# Patient Record
Sex: Female | Born: 1965 | Race: Black or African American | Hispanic: No | Marital: Single | Smoking: Never smoker
Health system: Southern US, Community
[De-identification: ages and names within clinical notes are randomized; demographics above are authoritative.]

## PROBLEM LIST (undated history)

## (undated) DIAGNOSIS — R011 Cardiac murmur, unspecified: Secondary | ICD-10-CM

## (undated) DIAGNOSIS — I639 Cerebral infarction, unspecified: Secondary | ICD-10-CM

## (undated) DIAGNOSIS — I10 Essential (primary) hypertension: Secondary | ICD-10-CM

## (undated) DIAGNOSIS — F32A Depression, unspecified: Secondary | ICD-10-CM

## (undated) DIAGNOSIS — R519 Headache, unspecified: Secondary | ICD-10-CM

## (undated) HISTORY — DX: Depression, unspecified: F32.A

## (undated) HISTORY — DX: Cardiac murmur, unspecified: R01.1

## (undated) HISTORY — PX: APPENDECTOMY: SHX54

## (undated) HISTORY — PX: ABDOMINAL HYSTERECTOMY: SHX81

## (undated) HISTORY — DX: Headache, unspecified: R51.9

---

## 1997-12-04 ENCOUNTER — Inpatient Hospital Stay (HOSPITAL_COMMUNITY): Admission: AD | Admit: 1997-12-04 | Discharge: 1997-12-04 | Payer: Self-pay | Admitting: *Deleted

## 1998-04-02 ENCOUNTER — Emergency Department (HOSPITAL_COMMUNITY): Admission: EM | Admit: 1998-04-02 | Discharge: 1998-04-02 | Payer: Self-pay | Admitting: Emergency Medicine

## 1998-10-28 ENCOUNTER — Inpatient Hospital Stay (HOSPITAL_COMMUNITY): Admission: AD | Admit: 1998-10-28 | Discharge: 1998-10-28 | Payer: Self-pay | Admitting: Obstetrics

## 1999-09-28 ENCOUNTER — Emergency Department (HOSPITAL_COMMUNITY): Admission: EM | Admit: 1999-09-28 | Discharge: 1999-09-28 | Payer: Self-pay | Admitting: Emergency Medicine

## 1999-10-07 ENCOUNTER — Encounter: Admission: RE | Admit: 1999-10-07 | Discharge: 1999-10-07 | Payer: Self-pay | Admitting: Family Medicine

## 1999-10-07 ENCOUNTER — Encounter: Payer: Self-pay | Admitting: Family Medicine

## 2000-05-04 ENCOUNTER — Emergency Department (HOSPITAL_COMMUNITY): Admission: EM | Admit: 2000-05-04 | Discharge: 2000-05-05 | Payer: Self-pay | Admitting: Internal Medicine

## 2001-12-28 ENCOUNTER — Other Ambulatory Visit: Admission: RE | Admit: 2001-12-28 | Discharge: 2001-12-28 | Payer: Self-pay | Admitting: Family Medicine

## 2004-05-10 ENCOUNTER — Emergency Department (HOSPITAL_COMMUNITY): Admission: EM | Admit: 2004-05-10 | Discharge: 2004-05-10 | Payer: Self-pay | Admitting: Emergency Medicine

## 2009-05-11 ENCOUNTER — Ambulatory Visit (HOSPITAL_COMMUNITY): Admission: RE | Admit: 2009-05-11 | Discharge: 2009-05-12 | Payer: Self-pay | Admitting: Obstetrics & Gynecology

## 2009-05-11 ENCOUNTER — Encounter (INDEPENDENT_AMBULATORY_CARE_PROVIDER_SITE_OTHER): Payer: Self-pay | Admitting: Obstetrics & Gynecology

## 2010-04-16 LAB — CBC
HCT: 29.1 % — ABNORMAL LOW (ref 36.0–46.0)
Hemoglobin: 9.4 g/dL — ABNORMAL LOW (ref 12.0–15.0)
MCHC: 32.4 g/dL (ref 30.0–36.0)
MCV: 78.2 fL (ref 78.0–100.0)
Platelets: 282 10*3/uL (ref 150–400)
RBC: 3.72 MIL/uL — ABNORMAL LOW (ref 3.87–5.11)
RDW: 16.4 % — ABNORMAL HIGH (ref 11.5–15.5)
WBC: 17.5 10*3/uL — ABNORMAL HIGH (ref 4.0–10.5)

## 2010-04-17 LAB — CBC
HCT: 34.6 % — ABNORMAL LOW (ref 36.0–46.0)
Hemoglobin: 11.2 g/dL — ABNORMAL LOW (ref 12.0–15.0)
MCHC: 32.4 g/dL (ref 30.0–36.0)
MCV: 77.3 fL — ABNORMAL LOW (ref 78.0–100.0)
Platelets: 333 10*3/uL (ref 150–400)
RBC: 4.48 MIL/uL (ref 3.87–5.11)
RDW: 16.5 % — ABNORMAL HIGH (ref 11.5–15.5)
WBC: 7.5 10*3/uL (ref 4.0–10.5)

## 2010-04-17 LAB — TYPE AND SCREEN
ABO/RH(D): O POS
Antibody Screen: NEGATIVE

## 2010-04-17 LAB — BASIC METABOLIC PANEL
BUN: 4 mg/dL — ABNORMAL LOW (ref 6–23)
CO2: 27 mEq/L (ref 19–32)
Calcium: 8.7 mg/dL (ref 8.4–10.5)
Chloride: 105 mEq/L (ref 96–112)
Creatinine, Ser: 0.58 mg/dL (ref 0.4–1.2)
GFR calc Af Amer: >60 mL/min (ref >60)
GFR calc non Af Amer: >60 mL/min (ref >60)
Glucose, Bld: 67 mg/dL — ABNORMAL LOW (ref 70–99)
Potassium: 3.9 mEq/L (ref 3.5–5.1)
Sodium: 138 mEq/L (ref 135–145)

## 2010-04-17 LAB — ABO/RH: ABO/RH(D): O POS

## 2010-04-17 LAB — PREGNANCY, URINE: Preg Test, Ur: NEGATIVE

## 2011-03-03 ENCOUNTER — Emergency Department (INDEPENDENT_AMBULATORY_CARE_PROVIDER_SITE_OTHER)
Admission: EM | Admit: 2011-03-03 | Discharge: 2011-03-03 | Disposition: A | Discharge status: Home / Self Care | Payer: BC Managed Care – PPO | Source: Home / Self Care | Attending: Emergency Medicine | Admitting: Emergency Medicine

## 2011-03-03 ENCOUNTER — Encounter (HOSPITAL_COMMUNITY): Payer: Self-pay | Admitting: *Deleted

## 2011-03-03 DIAGNOSIS — L600 Ingrowing nail: Secondary | ICD-10-CM

## 2011-03-03 HISTORY — DX: Essential (primary) hypertension: I10

## 2011-03-03 MED ORDER — TRAMADOL HCL 50 MG PO TABS: 100.0000 mg | ORAL_TABLET | Freq: Three times a day (TID) | ORAL | Status: AC | PRN

## 2011-03-03 MED ORDER — MUPIROCIN 2 % EX OINT: TOPICAL_OINTMENT | Freq: Three times a day (TID) | CUTANEOUS | Status: DC

## 2011-03-03 MED ORDER — CEPHALEXIN 500 MG PO CAPS: 500.0000 mg | ORAL_CAPSULE | Freq: Three times a day (TID) | ORAL | Status: AC

## 2011-03-03 NOTE — ED Notes (Signed)
Denies injury.  C/O right lateral great toe pain since approx Nov.  Started to get worse since Dec.  Has not been taking measures to help alleviate discomfort.  C/O difficulty wearing shoes or have anything touch it.

## 2011-03-03 NOTE — ED Provider Notes (Signed)
Chief Complaint  Patient presents with  . Toe Pain    History of Present Illness:   The patient has had a three-day history of pain and swelling of the medial nail fold of the right great toe. She denies any injury. There's been no fever or chills, no drainage of pus.  Review of Systems:  Other than noted above, the patient denies any of the following symptoms: Systemic:  No fevers, chills, sweats, or aches.  No fatigue or tiredness. Musculoskeletal:  No joint pain, arthritis, bursitis, swelling, back pain, or neck pain. Neurological:  No muscular weakness, paresthesias, headache, or trouble with speech or coordination.  No dizziness.   PMFSH:  Past medical history, family history, social history, meds, and allergies were reviewed.  Physical Exam:   Vital signs:  BP 125/88  Pulse 66  Temp(Src) 98.8 F (37.1 C) (Oral)  Resp 20  SpO2 100% Gen:  Alert and oriented times 3.  In no distress. Musculoskeletal: There was slight swelling, erythema, and tenderness to palpation of the medial nail fold of the right great toe and there was no collection of pus. No granulation tissue, no drainage, the nail itself appeared normal. Otherwise, all joints had a full a ROM with no swelling, bruising or deformity.  No edema, pulses full. Extremities were warm and pink.  Capillary refill was brisk.  Skin:  Clear, warm and dry.  No rash. Neuro:  Alert and oriented times 3.  Muscle strength was normal.  Sensation was intact to light touch.   Assessment:   Diagnoses that have been ruled out:  None  Diagnoses that are still under consideration:  None  Final diagnoses:  Ingrown right big toenail    Plan:   1.  The following meds were prescribed:   New Prescriptions   CEPHALEXIN (KEFLEX) 500 MG CAPSULE    Take 1 capsule (500 mg total) by mouth 3 (three) times daily.   MUPIROCIN OINTMENT (BACTROBAN) 2 %    Apply topically 3 (three) times daily.   TRAMADOL (ULTRAM) 50 MG TABLET    Take 2 tablets (100 mg  total) by mouth every 8 (eight) hours as needed for pain.   2.  The patient was instructed in symptomatic care, including rest and activity, elevation, application of ice and compression.  Appropriate handouts were given. 3.  The patient was told to return if becoming worse in any way, if no better in 3 or 4 days, and given some red flag symptoms that would indicate earlier return.   4.  The patient was told to follow up In one week for a nail splitting if no improvement.   Roque Lias, MD 03/03/11 [phone]

## 2011-03-11 ENCOUNTER — Encounter (HOSPITAL_COMMUNITY): Payer: Self-pay | Admitting: *Deleted

## 2011-03-11 ENCOUNTER — Emergency Department (INDEPENDENT_AMBULATORY_CARE_PROVIDER_SITE_OTHER)
Admission: EM | Admit: 2011-03-11 | Discharge: 2011-03-11 | Disposition: A | Discharge status: Home / Self Care | Payer: BC Managed Care – PPO | Source: Home / Self Care | Attending: Emergency Medicine | Admitting: Emergency Medicine

## 2011-03-11 DIAGNOSIS — L600 Ingrowing nail: Secondary | ICD-10-CM

## 2011-03-11 MED ORDER — HYDROCODONE-ACETAMINOPHEN 5-325 MG PO TABS: ORAL_TABLET | ORAL | Status: AC

## 2011-03-11 NOTE — ED Provider Notes (Signed)
Chief Complaint  Patient presents with  . Ingrown Toenail    History of Present Illness:  The patient returns this evening for a nail splitting procedure. I saw on February 4 with an ingrown her medial right great toe. We decided to try other treatment first. She was given cephalexin, mupirocin, and tramadol for the pain. She's been doing this now for 8 days and does not feel any better. The nail fold still painful.  Review of Systems:  Other than noted above, the patient denies any of the following symptoms: Systemic:  No fevers, chills, sweats, or aches.  No fatigue or tiredness. Musculoskeletal:  No joint pain, arthritis, bursitis, swelling, back pain, or neck pain. Neurological:  No muscular weakness, paresthesias, headache, or trouble with speech or coordination.  No dizziness.   PMFSH:  Past medical history, family history, social history, meds, and allergies were reviewed.  Physical Exam:   Vital signs:  BP 134/88  Pulse 88  Temp(Src) 98.4 F (36.9 C) (Oral)  Resp 16  SpO2 100% Gen:  Alert and oriented times 3.  In no distress. Musculoskeletal: The medial nail fold the right great toe is tender to palpation. There is no erythema, swelling, or pus Otherwise, all joints had a full a ROM with no swelling, bruising or deformity.  No edema, pulses full. Extremities were warm and pink.  Capillary refill was brisk.  Skin:  Clear, warm and dry.  No rash. Neuro:  Alert and oriented times 3.  Muscle strength was normal.  Sensation was intact to light touch.   Procedure Note:  Verbal informed consent was obtained from the patient.  Risks and benefits were outlined with the patient.  Patient understands and accepts these risks.  Identity of the patient was confirmed verbally and by armband.    Procedure was performed as followed:  The toe was prepped with Betadine and alcohol and anesthetized with a ring block with 2% Xylocaine without epinephrine, 5 mL being used in all. After satisfactory  anesthesia was obtained, the nail was split down to the base and the ingrown portion was avulsed. Bleeding was stopped with steady pressure for 2-3 minutes. A sterile pressure dressing was applied and patient was instructed in wound care.  Patient tolerated the procedure well without any immediate complications.    Assessment:   Diagnoses that have been ruled out:  None  Diagnoses that are still under consideration:  None  Final diagnoses:  Ingrown toenail    Plan:   1.  The following meds were prescribed:   New Prescriptions   HYDROCODONE-ACETAMINOPHEN (NORCO) 5-325 MG PER TABLET    1 to 2 tabs every 4 to 6 hours as needed for pain.   2.  The patient was instructed in symptomatic care, including rest and activity, elevation, application of ice and compression.  Appropriate handouts were given. 3.  The patient was told to return if becoming worse in any way, if no better in 3 or 4 days, and given some red flag symptoms that would indicate earlier return.   4.  The patient was told to follow up as needed.   Roque Lias, MD 03/11/11 [id number]

## 2011-03-11 NOTE — ED Notes (Signed)
Pt was seen 2/4 for right great toe ingrown toenail.  Has been taking cephalexin as directed along with tramadol and Mupiricin oint, but denies any improvement.

## 2016-03-20 ENCOUNTER — Encounter (HOSPITAL_COMMUNITY): Payer: Self-pay

## 2016-03-20 ENCOUNTER — Emergency Department (HOSPITAL_COMMUNITY)
Admission: EM | Admit: 2016-03-20 | Discharge: 2016-03-20 | Disposition: A | Discharge status: Home / Self Care | Payer: Self-pay | Attending: Emergency Medicine | Admitting: Emergency Medicine

## 2016-03-20 ENCOUNTER — Emergency Department (HOSPITAL_COMMUNITY): Payer: Self-pay

## 2016-03-20 DIAGNOSIS — Z8673 Personal history of transient ischemic attack (TIA), and cerebral infarction without residual deficits: Secondary | ICD-10-CM | POA: Insufficient documentation

## 2016-03-20 DIAGNOSIS — G44209 Tension-type headache, unspecified, not intractable: Secondary | ICD-10-CM | POA: Insufficient documentation

## 2016-03-20 HISTORY — DX: Cerebral infarction, unspecified: I63.9

## 2016-03-20 NOTE — ED Triage Notes (Signed)
Per Pt, Pt is coming from home with complaints of generalized body aches, chills, and a headache. Pt reports being nauseous with no vomiting or diarrhea.

## 2016-03-20 NOTE — Discharge Instructions (Signed)
Stay well hydrated. Use over the counter medications such as ibuprofen and tylenol for headache. Follow up with your primary care provider.  Return to the emergency department if you experience any worsening headache, confusion, numbness in your extremities, nausea, vomiting or any other concerning symptoms.

## 2016-03-20 NOTE — ED Notes (Signed)
Patient transported to X-ray 

## 2016-03-20 NOTE — ED Notes (Signed)
Pt departed in NAD, refused use of wheelchair.  

## 2016-03-20 NOTE — ED Provider Notes (Signed)
[facility] DEPT Provider Note   CSN: [id number] Arrival date & time: 03/20/16  1853     History   Chief Complaint Chief Complaint  Patient presents with  . Generalized Body Aches    HPI Toni Roberson is a 51 y.o. female presenting today with a headache. Headache was worse yesterday but has been improving today. She describes the headache as a pressure across her forehead. No photophobia. No aggravating or alleviating factors other than mild relief from Tylenol. She also reports that she took one of her mother's high blood pressure medication and is unsure what it was. Patient denies any focal deficits. She also endorses a cough that has been productive and ongoing for over a week. She describes symptoms consistent with a flulike illness 1 week ago which had been improving overall. She had been feeling better since Sunday but now has a headache. She notes a slight decrease in appetite but has been drinking well. Denies facial pain or pressure, nausea, vomiting, diarrhea, dysuria, hematuria or any other symptoms.  She reports a history of hypertension but has been noncompliant with medications for a year. She thought she felt fine and didn't needed.  HPI  Past Medical History:  Diagnosis Date  . Hypertension   . 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   Past Surgical History:  Procedure Laterality Date  . ABDOMINAL HYSTERECTOMY    . APPENDECTOMY      OB History    No data available       Home Medications    Prior to Admission medications   Not on File    Family History No family history on file.  Social History Social History  Substance Use Topics  . Smoking status: Never Smoker  . Smokeless tobacco: Never Used  . Alcohol use No     Allergies   Patient has no known allergies.   Review of Systems Review of Systems  Constitutional: Positive for fatigue. Negative for activity change, chills and fever.    HENT: Negative for ear pain, sore throat and trouble swallowing.   Eyes: Negative for photophobia, pain and visual disturbance.  Respiratory: Positive for cough. Negative for choking, chest tightness, shortness of breath and wheezing.   Cardiovascular: Negative for chest pain, palpitations and leg swelling.  Gastrointestinal: Negative for abdominal distention, abdominal pain, blood in stool, diarrhea, nausea and vomiting.  Genitourinary: Negative for difficulty urinating, dysuria and hematuria.  Musculoskeletal: Negative for arthralgias, back pain, gait problem, joint swelling, myalgias, neck pain and neck stiffness.  Skin: Negative for color change, pallor and rash.  Neurological: Positive for headaches. Negative for dizziness, tremors, seizures, syncope, facial asymmetry, speech difficulty, weakness, light-headedness and numbness.  All other systems reviewed and are negative.    Physical Exam Updated Vital Signs BP (!) 144/108   Pulse 73   Temp 98.3 F (36.8 C) (Oral)   Resp 16   Ht 5\' 4"  (1.626 m)   Wt 71.7 kg   SpO2 100%   BMI 27.12 kg/m   Physical Exam  Constitutional: She is oriented to person, place, and time. She appears well-developed and well-nourished. No distress.  Afebrile, nontoxic-appearing, sitting comfortably in bed in no acute distress.  HENT:  Head: Normocephalic and atraumatic.  Mouth/Throat: No oropharyngeal exudate.  Eyes: Conjunctivae and EOM are normal. Pupils are equal, round, and reactive to light. Right eye exhibits no discharge. Left eye exhibits no discharge.  Neck: Normal range of motion. Neck supple.  Cardiovascular: Normal rate, regular rhythm, normal heart sounds and intact distal pulses.   No murmur heard. Pulmonary/Chest: Effort normal. No respiratory distress. She has no wheezes. She has no rales. She exhibits no tenderness.  Noted slight decrease in breath sounds on the right side  Abdominal: Soft. She exhibits no distension. There is no  tenderness. There is no rebound and no guarding.  Musculoskeletal: Normal range of motion. She exhibits no edema, tenderness or deformity.  Neurological: She is alert and oriented to person, place, and time. No cranial nerve deficit or sensory deficit. She exhibits normal muscle tone ( ). Coordination normal.  Neurologic Exam:   - Mental status: Patient is alert and cooperative. Fluent speech and words are clear. Coherent thought processes and insight is good. Patient is oriented x 4 to person, place, time and event.   - Cranial nerves:  CN III, IV, VI: pupils equally round, reactive to light both direct and conscensual. Full extra-ocular movement. CN V: motor temporalis and masseter strength intact. CN VII : muscles of facial expression intact. CN X :  midline uvula. XI strength of sternocleidomastoid and trapezius muscles 5/5, XII: tongue is midline when protruded.  - Motor: No involuntary movements. Muscle tone and bulk normal throughout. Muscle strength is 5/5 in bilateral shoulder abduction, elbow flexion and extension, wrist flexion and extension, thumb opposition, grip, hip extension, flexion, leg flexion and extension, ankle dorsiflexion and plantar flexion.   - Sensory: Proprioception, light tough sensation intact in all extremities.   - Cerebellar: rapid alternating movements and point to point movement intact in upper and lower extremities. Normal stance and gait.   Skin: Skin is warm. No rash noted. She is not diaphoretic. No pallor.  Psychiatric: She has a normal mood and affect. Her behavior is normal.  Nursing note and vitals reviewed.    ED Treatments / Results  Labs (all labs ordered are listed, but only abnormal results are displayed) Labs Reviewed - No data to display  EKG  EKG Interpretation None       Radiology Dg Chest 2 View  Result Date: 03/20/2016 CLINICAL DATA:  Acute onset of productive and dry cough. Initial encounter. EXAM: CHEST  2 VIEW COMPARISON:   None. FINDINGS: The lungs are well-aerated and clear. There is no evidence of focal opacification, pleural effusion or pneumothorax. The heart is normal in size; the mediastinal contour is within normal limits. No acute osseous abnormalities are seen. IMPRESSION: No acute cardiopulmonary process seen. Electronically Signed   By: Garald Balding M.D.   On: 03/20/2016 22:36    Procedures Procedures (including critical care time)  Medications Ordered in ED Medications - No data to display   Initial Impression / Assessment and Plan / ED Course  I have reviewed the triage vital signs and the nursing notes.  Pertinent labs & imaging results that were available during my care of the patient were reviewed by me and considered in my medical decision making (see chart for details).      51 year old female with remote history of hypertension noncompliant with medication presenting with 2 days of headache that has been improving. She has had a flulike illness this past week and is still coughing reporting productive cough. Obtain chest x-ray to rule out pneumonia CXR: negative  Reassuring exam. normal neuro. No focal deficits. When reassessed after CXR, she had further improved and refused any pain medications. Patient stated that she was fine and her family  just wanted her to get checked out. She is stable and well-appearing and ready to go home.   Discharge home with symptomatic relief and close PCP follow up.  Patient states she will be contacting her PCP to reinitiate antihypertensive medications and follow up.  Discussed strict return precautions. Patient was advised to return to the emergency department if experiencing any new or worsening symptoms. Patient clearly understood instructions and agreed with discharge plan.  Patient was discussed with Dr. Venora Maples who agrees with assessment and plan.  Final Clinical Impressions(s) / ED Diagnoses   Final diagnoses:  Acute non intractable  tension-type headache    New Prescriptions New Prescriptions   No medications on file     Dossie Der 03/20/16 [place], MD 03/21/16 [phone]

## 2016-03-20 NOTE — ED Notes (Addendum)
Pt reports multiple family members recently being ill with flu-like symptoms. Presently denies N/V or other pain complaint, only H/A.

## 2018-04-19 ENCOUNTER — Ambulatory Visit (HOSPITAL_COMMUNITY)
Admission: EM | Admit: 2018-04-19 | Discharge: 2018-04-19 | Disposition: A | Discharge status: Home / Self Care | Payer: Self-pay | Attending: Internal Medicine | Admitting: Internal Medicine

## 2018-04-19 ENCOUNTER — Other Ambulatory Visit: Payer: Self-pay

## 2018-04-19 ENCOUNTER — Encounter (HOSPITAL_COMMUNITY): Payer: Self-pay

## 2018-04-19 DIAGNOSIS — B351 Tinea unguium: Secondary | ICD-10-CM | POA: Insufficient documentation

## 2018-04-19 DIAGNOSIS — M25559 Pain in unspecified hip: Secondary | ICD-10-CM | POA: Insufficient documentation

## 2018-04-19 DIAGNOSIS — I10 Essential (primary) hypertension: Secondary | ICD-10-CM | POA: Insufficient documentation

## 2018-04-19 LAB — CBC
HEMATOCRIT: 41 % (ref 36.0–46.0)
HEMOGLOBIN: 12.5 g/dL (ref 12.0–15.0)
MCH: 23.5 pg — AB (ref 26.0–34.0)
MCHC: 30.5 g/dL (ref 30.0–36.0)
MCV: 77.2 fL — ABNORMAL LOW (ref 80.0–100.0)
Platelets: 412 10*3/uL — ABNORMAL HIGH (ref 150–400)
RBC: 5.31 MIL/uL — ABNORMAL HIGH (ref 3.87–5.11)
RDW: 14.4 % (ref 11.5–15.5)
WBC: 7.1 10*3/uL (ref 4.0–10.5)
nRBC: 0 % (ref 0.0–0.2)

## 2018-04-19 LAB — POCT URINALYSIS DIP (DEVICE)
BILIRUBIN URINE: NEGATIVE
Glucose, UA: NEGATIVE mg/dL
Hgb urine dipstick: NEGATIVE
Ketones, ur: NEGATIVE mg/dL
Leukocytes,Ua: NEGATIVE
NITRITE: NEGATIVE
PH: 7 (ref 5.0–8.0)
Protein, ur: NEGATIVE mg/dL
Specific Gravity, Urine: 1.025 (ref 1.005–1.030)
UROBILINOGEN UA: 0.2 mg/dL (ref 0.0–1.0)

## 2018-04-19 LAB — COMPREHENSIVE METABOLIC PANEL
ALK PHOS: 80 U/L (ref 38–126)
ALT: 21 U/L (ref 0–44)
ANION GAP: 6 (ref 5–15)
AST: 21 U/L (ref 15–41)
Albumin: 3.6 g/dL (ref 3.5–5.0)
BILIRUBIN TOTAL: 0.3 mg/dL (ref 0.3–1.2)
BUN: 7 mg/dL (ref 6–20)
CALCIUM: 9.3 mg/dL (ref 8.9–10.3)
CO2: 27 mmol/L (ref 22–32)
Chloride: 105 mmol/L (ref 98–111)
Creatinine, Ser: 0.7 mg/dL (ref 0.44–1.00)
GFR calc Af Amer: >60 mL/min (ref >60)
GFR calc non Af Amer: >60 mL/min (ref >60)
GLUCOSE: 85 mg/dL (ref 70–99)
Potassium: 3.6 mmol/L (ref 3.5–5.1)
Sodium: 138 mmol/L (ref 135–145)
TOTAL PROTEIN: 7.5 g/dL (ref 6.5–8.1)

## 2018-04-19 MED ORDER — LISINOPRIL-HYDROCHLOROTHIAZIDE 10-12.5 MG PO TABS: 1.0000 | ORAL_TABLET | Freq: Every day | ORAL | 0 refills | Status: DC

## 2018-04-19 NOTE — ED Notes (Signed)
Patient verbalizes understanding of discharge instructions. Opportunity for questioning and answers were provided. Patient discharged from [facility] by PA.

## 2018-04-19 NOTE — ED Triage Notes (Signed)
Patient presents to Urgent Care with complaints of right hip pain since about a week and a half. Patient states she feels like her circulation may be compromised, has had difficulty walking, toenails on both feet (with exception of the third toenail on both feet) have become black "it has been months".  Pt also complains of right sided chest tightness since two days ago. Pt denies cough or fever.

## 2018-04-19 NOTE — ED Provider Notes (Signed)
[facility]    CSN: [id number] Arrival date & time: 04/19/18  1037     History   Chief Complaint Chief Complaint  Patient presents with  . Hip Pain    HPI Toni Roberson is a 53 y.o. female.   1-Onset of R lateral hip pain 2-3 weeks ago, pain is provoked if trying to lift leg to step down. Denies injuring herself. Has not seen her PCP in 2 y.   2- R chest tightness onset last night which lasted for 20-30 min and last night movement made it worse so she sat  Up helped. She fell asleep while sitting up and when she work up the pain was gone and has been gone since. Denies N/V, sweating or dizziness during this episode.   3- noticed discoloration of toesnails of both feet x for years. She wants this checked. Denies smoking.    4- Has hx of HNT and ran out of her med 1-2 ys ago" I cant remember" and on occasion checks her BP, but normally is in the 355'D systolic, last time she checked it was systolic 322, cant recall the diastolic #. She used to take a BP med that had a low dose of water pill.      Past Medical History:  Diagnosis Date  . Hypertension   . 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   Past Surgical History:  Procedure Laterality Date  . ABDOMINAL HYSTERECTOMY    . APPENDECTOMY      OB History   No obstetric history on file.      Home Medications    Prior to Admission medications   Not on File    Family History History reviewed. No pertinent family history.  Social History Social History   Tobacco Use  . Smoking status: Never Smoker  . Smokeless tobacco: Never Used  Substance Use Topics  . Alcohol use: No  . Drug use: No     Allergies   Patient has no known allergies.   Review of Systems Review of Systems  Constitutional: Negative for chills, diaphoresis and fever.  HENT: Negative for congestion.   Eyes: Negative for discharge and visual disturbance.  Respiratory:  Negative for cough, chest tightness and shortness of breath.   Cardiovascular: Negative for chest pain, palpitations and leg swelling.  Genitourinary: Negative for dysuria.  Musculoskeletal: Negative for gait problem.       Lateral R hip pain  Skin: Negative for rash.       Has dark nails, see HPI  Neurological: Negative for dizziness, numbness and headaches.   Physical Exam Triage Vital Signs ED Triage Vitals  Enc Vitals Group     BP 04/19/18 1132 (S) (!) 178/116     Pulse Rate 04/19/18 1132 68     Resp 04/19/18 1132 18     Temp 04/19/18 1132 98.2 F (36.8 C)     Temp Source 04/19/18 1132 Oral     SpO2 04/19/18 1132 99 %     Weight --      Height --      Head Circumference --      Peak Flow --      Pain Score 04/19/18 1131 4     Pain Loc --      Pain Edu? --      Excl. in [place]? --    No data found.  Updated Vital  Signs BP (S) (!) 178/116 (BP Location: Left Arm) Comment: used to be on BP meds, is not currently  Pulse 68   Temp 98.2 F (36.8 C) (Oral)   Resp 18   SpO2 99%   Visual Acuity Right Eye Distance:   Left Eye Distance:   Bilateral Distance:    Right Eye Near:   Left Eye Near:    Bilateral Near:     Physical Exam Physical Exam Vitals signs and nursing note reviewed.  Constitutional:      General: She is not in acute distress.    Appearance: Normal appearance. She is not ill-appearing, toxic-appearing or diaphoretic.  HENT:     Head: Normocephalic.     Right Ear: Tympanic membrane, ear canal and external ear normal.     Left Ear: Tympanic membrane, ear canal and external ear normal.     Nose: Nose normal.     Mouth/Throat:     Mouth: Mucous membranes are moist.  Eyes:     General: No scleral icterus.       Right eye: No discharge.        Left eye: No discharge.     Conjunctiva/sclera: Conjunctivae normal.  Neck:     Musculoskeletal: Neck supple. No neck rigidity. No carotid bruits.  Cardiovascular:     Rate and Rhythm: Normal rate and regular  rhythm.     Heart sounds: No murmur. +2/4 pedal pulses. No edema. Capillary refill <3                             Seconds.  Pulmonary:     Effort: Pulmonary effort is normal.     Breath sounds: Normal breath sounds.   Musculoskeletal: Normal range of motion.  R HIP- Leg length is equal, with mild local tenderness on lateral region above the trochanter, but mild pain provoked upon checking internal rotation of her hip.  SPINE- normal ROM with no pain.  Lymphadenopathy:     Cervical: No cervical adenopathy.  Skin:    General: Skin is warm and dry.     Coloration: Skin is not jaundiced.     Findings: No rash.     TOENAILS- all are thick and black.  Neurological:     Mental Status: She is alert and oriented to person, place, and time.     Gait: Gait normal.  Psychiatric:        Mood and Affect: Mood normal.        Behavior: Behavior normal.        Thought Content: Thought content normal.        Judgment: Judgment normal.  Repeated BP 178/111 UC Treatments / Results  Labs (all labs ordered are listed, but only abnormal results are displayed) Labs Reviewed - No data to display  EKG normal  Radiology No results found.  Procedures ED EKG Date/Time: 04/19/2018 12:58 PM Performed by: Shelby Mattocks, PA-C Authorized by: Shelby Mattocks, PA-C       Medications Ordered in UC Medications - No data to display  Initial Impression / Assessment and Plan / UC Course  I have reviewed the triage vital signs and the nursing notes. Pertinent labs  results that were available during my care of the patient were reviewed by me and considered in my medical decision making (see chart for details). UA here neg. CBC and CMP pending. I will inform her of this results when back.  I reviewed  pt's case with Dr Meda Coffee who agreed with my plan.  She was placed on Lisinopril/HCTZ 10/12.5 qd. See instructions.  Needs to Fu with PCP as scheduled which I asked her to call them  to make an apt. And has one for the end of this month.    Final Clinical Impressions(s) / UC Diagnoses   Final diagnoses:  None   Discharge Instructions   None    ED Prescriptions    None     Controlled Substance Prescriptions [place] Controlled Substance Registry consulted?    Shelby Mattocks, PA-C 04/19/18 1329

## 2018-04-19 NOTE — Discharge Instructions (Addendum)
Keep your appointment with your family Dr for the end of the month as you have scheduled today. Your EKG today is normal. Take the copy to your Dr. If the chest pain comes back, please dont just wait, check your blood pressure and pulse, if BP is high and pulse feels irregular or more than 100, your ambulance to be taken to the ER.  Your urine test shows no sugar, blood or protein.  Take the blood pressure medication every morning. Reduce sodium in your diet Do blood pressure diaries starting tomorrow, and take it 2-3 hours after taking your blood pressure medication which I am starting you on today. If your blood pressure remains more than 140/90, you need to call your family Dr and ask for advise what to do in regards of the blood pressure medication.  For your hip, look up exercises to help IT band ( iliotibial band)  problemson [employer] and print out I am giving you, but in the mean time, also use alternation of ice and heat and do this for a few days 2-3 times a day. Do not take Ibuprofen type medications since they can raise your blood pressure more. You may take Tylenol as needed for pain. OK to apply Aspercreme on area of pain as well.  The fungal nails can be addressed by your family Dr since medication for this can last up to 3 months and labs have to be done to monitor.  Please sign up for Mychart so I can let you know about your lab results.

## 2018-04-19 NOTE — ED Notes (Signed)
Patient ambulatory to bathroom with steady gait at this time to provide urine sample 

## 2018-05-10 ENCOUNTER — Other Ambulatory Visit: Payer: Self-pay | Admitting: Internal Medicine

## 2018-09-13 ENCOUNTER — Other Ambulatory Visit: Payer: Self-pay

## 2018-09-13 ENCOUNTER — Ambulatory Visit (INDEPENDENT_AMBULATORY_CARE_PROVIDER_SITE_OTHER): Payer: Self-pay

## 2018-09-13 ENCOUNTER — Encounter (HOSPITAL_COMMUNITY): Payer: Self-pay | Admitting: Emergency Medicine

## 2018-09-13 ENCOUNTER — Ambulatory Visit (HOSPITAL_COMMUNITY)
Admission: EM | Admit: 2018-09-13 | Discharge: 2018-09-13 | Disposition: A | Discharge status: Home / Self Care | Payer: Self-pay | Attending: Family Medicine | Admitting: Family Medicine

## 2018-09-13 DIAGNOSIS — R1032 Left lower quadrant pain: Secondary | ICD-10-CM | POA: Insufficient documentation

## 2018-09-13 LAB — CBC
HCT: 36.3 % (ref 36.0–46.0)
Hemoglobin: 11.5 g/dL — ABNORMAL LOW (ref 12.0–15.0)
MCH: 24.7 pg — ABNORMAL LOW (ref 26.0–34.0)
MCHC: 31.7 g/dL (ref 30.0–36.0)
MCV: 77.9 fL — ABNORMAL LOW (ref 80.0–100.0)
Platelets: 358 10*3/uL (ref 150–400)
RBC: 4.66 MIL/uL (ref 3.87–5.11)
RDW: 14.8 % (ref 11.5–15.5)
WBC: 13 10*3/uL — ABNORMAL HIGH (ref 4.0–10.5)
nRBC: 0 % (ref 0.0–0.2)

## 2018-09-13 LAB — POCT URINALYSIS DIP (DEVICE)
Bilirubin Urine: NEGATIVE
Glucose, UA: NEGATIVE mg/dL
Hgb urine dipstick: NEGATIVE
Ketones, ur: NEGATIVE mg/dL
Leukocytes,Ua: NEGATIVE
Nitrite: NEGATIVE
Protein, ur: NEGATIVE mg/dL
Specific Gravity, Urine: 1.025 (ref 1.005–1.030)
Urobilinogen, UA: 1 mg/dL (ref 0.0–1.0)
pH: 7 (ref 5.0–8.0)

## 2018-09-13 MED ORDER — METRONIDAZOLE 500 MG PO TABS: 500.0000 mg | ORAL_TABLET | Freq: Two times a day (BID) | ORAL | 0 refills | Status: DC

## 2018-09-13 MED ORDER — CIPROFLOXACIN HCL 500 MG PO TABS: 500.0000 mg | ORAL_TABLET | Freq: Two times a day (BID) | ORAL | 0 refills | Status: DC

## 2018-09-13 NOTE — ED Triage Notes (Signed)
Pain in left lower abdomen and pain in right flank area.  No pain with urination. Denies an injury.  Last bm was today and has had 5-6 loose stools.  Denies vomiting

## 2018-09-13 NOTE — Discharge Instructions (Signed)
You have left lower abdominal pain.  It is pretty severe.  You may have diverticulitis.  We had a discussion about the possibility of going to the emergency room. I am going to treat with antibiotics.  Take each antibiotic 1 pill 2 times a day.  Drink plenty of fluids.  You may eat a bland diet Call if you develop nausea or vomiting. If you have increased pain, protracted vomiting, fever, you must go to the emergency room

## 2018-09-14 NOTE — ED Provider Notes (Signed)
[employer]    CSN: [id number] Arrival date & time: 09/13/18  1553      History   Chief Complaint Chief Complaint  Toni Roberson presents with  . Flank Pain    HPI Toni Roberson is a 53 y.o. female.   HPI  Toni Roberson is here for abdominal pain.  Started today.  To the left lower quadrant.  It is cramping and severe.  No nausea.  Decreased appetite.  No fever or chills.  No body aches.  No flu symptoms.  No respiratory symptoms.  No known exposure COVID-19.  Toni Roberson states today Toni Roberson has had 5 or 6 loose bowel movements.  No blood or mucus in her bowels.  No recent travel.  No recent antibiotics.  No food that may have been spoiled. Toni Roberson is 36.  Toni Roberson is never had an colonoscopy.  Toni Roberson does not think Toni Roberson has any colon problems.  Has never been told Toni Roberson has diverticular disease.  Toni Roberson no longer has menstrual periods, and is postmenopausal.  Is not having any vaginal discharge or bleeding.  No urinary symptoms no frequency no hematuria.  Past Medical History:  Diagnosis Date  . Hypertension   . Stroke [facility])    Mild, 2006     There are no active problems to display for this Toni Roberson.   Past Surgical History:  Procedure Laterality Date  . ABDOMINAL HYSTERECTOMY    . APPENDECTOMY      OB History   No obstetric history on file.      Home Medications    Prior to Admission medications   Medication Sig Start Date End Date Taking? Authorizing Provider  ciprofloxacin (CIPRO) 500 MG tablet Take 1 tablet (500 mg total) by mouth 2 (two) times daily. 09/13/18   Raylene Everts, MD  lisinopril-hydrochlorothiazide (ZESTORETIC) 10-12.5 MG tablet Take 1 tablet by mouth daily. 04/19/18   Rodriguez-Southworth, Sunday Spillers, PA-C  metroNIDAZOLE (FLAGYL) 500 MG tablet Take 1 tablet (500 mg total) by mouth 2 (two) times daily. 09/13/18   Raylene Everts, MD    Family History No family history on file.  Social History Social History   Tobacco Use  . Smoking status: Never Smoker  .  Smokeless tobacco: Never Used  Substance Use Topics  . Alcohol use: No  . Drug use: No     Allergies   Toni Roberson has no known allergies.   Review of Systems Review of Systems  Constitutional: Negative for chills and fever.  HENT: Negative for ear pain and sore throat.   Eyes: Negative for pain and visual disturbance.  Respiratory: Negative for cough and shortness of breath.   Cardiovascular: Negative for chest pain and palpitations.  Gastrointestinal: Positive for abdominal pain and diarrhea. Negative for vomiting.  Genitourinary: Negative for dysuria, hematuria, vaginal bleeding and vaginal discharge.  Musculoskeletal: Negative for arthralgias and back pain.  Skin: Negative for color change and rash.  Neurological: Negative for seizures and syncope.  All other systems reviewed and are negative.    Physical Exam Triage Vital Signs ED Triage Vitals  Enc Vitals Group     BP 09/13/18 1722 (!) 141/99     Pulse Rate 09/13/18 1722 96     Resp 09/13/18 1722 18     Temp 09/13/18 1722 99.8 F (37.7 C)     Temp Source 09/13/18 1722 Oral     SpO2 09/13/18 1722 99 %     Weight --      Height --  Head Circumference --      Peak Flow --      Pain Score 09/13/18 1720 9     Pain Loc --      Pain Edu? --      Excl. in [place]? --    No data found.  Updated Vital Signs BP (!) 141/99 (BP Location: Right Arm)   Pulse 96   Temp 99.8 F (37.7 C) (Oral)   Resp 18   SpO2 99%       Physical Exam Constitutional:      General: Toni Roberson is not in acute distress.    Appearance: Toni Roberson is well-developed.     Comments: Appears uncomfortable.  Guarded movements  HENT:     Head: Normocephalic and atraumatic.     Nose: Nose normal.     Mouth/Throat:     Mouth: Mucous membranes are moist.  Eyes:     Conjunctiva/sclera: Conjunctivae normal.     Pupils: Pupils are equal, round, and reactive to light.  Neck:     Musculoskeletal: Normal range of motion.  Cardiovascular:     Rate and Rhythm:  Normal rate and regular rhythm.     Heart sounds: Normal heart sounds.  Pulmonary:     Effort: Pulmonary effort is normal. No respiratory distress.     Breath sounds: Normal breath sounds.  Abdominal:     General: There is no distension.     Palpations: Abdomen is soft.     Tenderness: There is abdominal tenderness. There is guarding. There is no right CVA tenderness, left CVA tenderness or rebound.     Comments: Tenderness leg to left lower quadrant.  Toni Roberson does have some guarding but no rebound.  Toni Roberson appears uncomfortable in this area is quite tender.  No masses palpable.  Musculoskeletal: Normal range of motion.  Lymphadenopathy:     Cervical: No cervical adenopathy.  Skin:    General: Skin is warm and dry.  Neurological:     General: No focal deficit present.     Mental Status: Toni Roberson is alert.  Psychiatric:        Mood and Affect: Mood normal.        Behavior: Behavior normal.      UC Treatments / Results  Labs (all labs ordered are listed, but only abnormal results are displayed) Labs Reviewed  CBC - Abnormal; Notable for the following components:      Result Value   WBC 13.0 (*)    Hemoglobin 11.5 (*)    MCV 77.9 (*)    MCH 24.7 (*)    All other components within normal limits  POCT URINALYSIS DIP (DEVICE)    EKG   Radiology Dg Abd 2 Views  Result Date: 09/13/2018 CLINICAL DATA:  Acute LEFT abdominal pain for 2 days. EXAM: ABDOMEN - 2 VIEW COMPARISON:  None. FINDINGS: The bowel gas pattern is normal. There is no evidence of free air. No radio-opaque calculi or other significant radiographic abnormality is seen. IMPRESSION: Negative. Electronically Signed   By: Margarette Canada M.D.   On: 09/13/2018 19:06    Procedures Procedures (including critical care time)  Medications Ordered in UC Medications - No data to display  Initial Impression / Assessment and Plan / UC Course  I have reviewed the triage vital signs and the nursing notes.  Pertinent labs & imaging  results that were available during my care of the Toni Roberson were reviewed by me and considered in my medical decision making (see chart for  details).  Clinical Course as of Sep 14 819  Mon Sep 13, 2018  1900 CBC [YN]  1907 CBC [YN]    Clinical Course User Index [YN] Raylene Everts, MD    I reviewed with the Toni Roberson that with an elevated white count would be reasonable to go the emergency room for additional testing With left lower quadrant pain is likely to be a colon disorder such as diverticulitis although and ovarian disorder is not ruled out.  Toni Roberson does not want to go the emergency room.  Toni Roberson prefers to be treated at home.  I will give her antibiotics with strict instructions that if Toni Roberson gets worse instead of better Toni Roberson needs to go to the ER.  Presumed diagnosis diverticulitis. Final Clinical Impressions(s) / UC Diagnoses   Final diagnoses:  LLQ abdominal pain     Discharge Instructions     You have left lower abdominal pain.  It is pretty severe.  You may have diverticulitis.  We had a discussion about the possibility of going to the emergency room. I am going to treat with antibiotics.  Take each antibiotic 1 pill 2 times a day.  Drink plenty of fluids.  You may eat a bland diet Call if you develop nausea or vomiting. If you have increased pain, protracted vomiting, fever, you must go to the emergency room   ED Prescriptions    Medication Sig Dispense Auth. Provider   ciprofloxacin (CIPRO) 500 MG tablet Take 1 tablet (500 mg total) by mouth 2 (two) times daily. 14 tablet Raylene Everts, MD   metroNIDAZOLE (FLAGYL) 500 MG tablet Take 1 tablet (500 mg total) by mouth 2 (two) times daily. 14 tablet Raylene Everts, MD     Controlled Substance Prescriptions [place] Controlled Substance Registry consulted? Not Applicable   Raylene Everts, MD 09/14/18 [phone]

## 2018-09-16 IMAGING — CR DG CHEST 2V
2 series · 2 of 2 positions shown · non-contrast
Comparison: None.

CLINICAL DATA: Acute onset of productive and dry cough. Initial
encounter.

EXAM:
CHEST  2 VIEW

[chest pa]
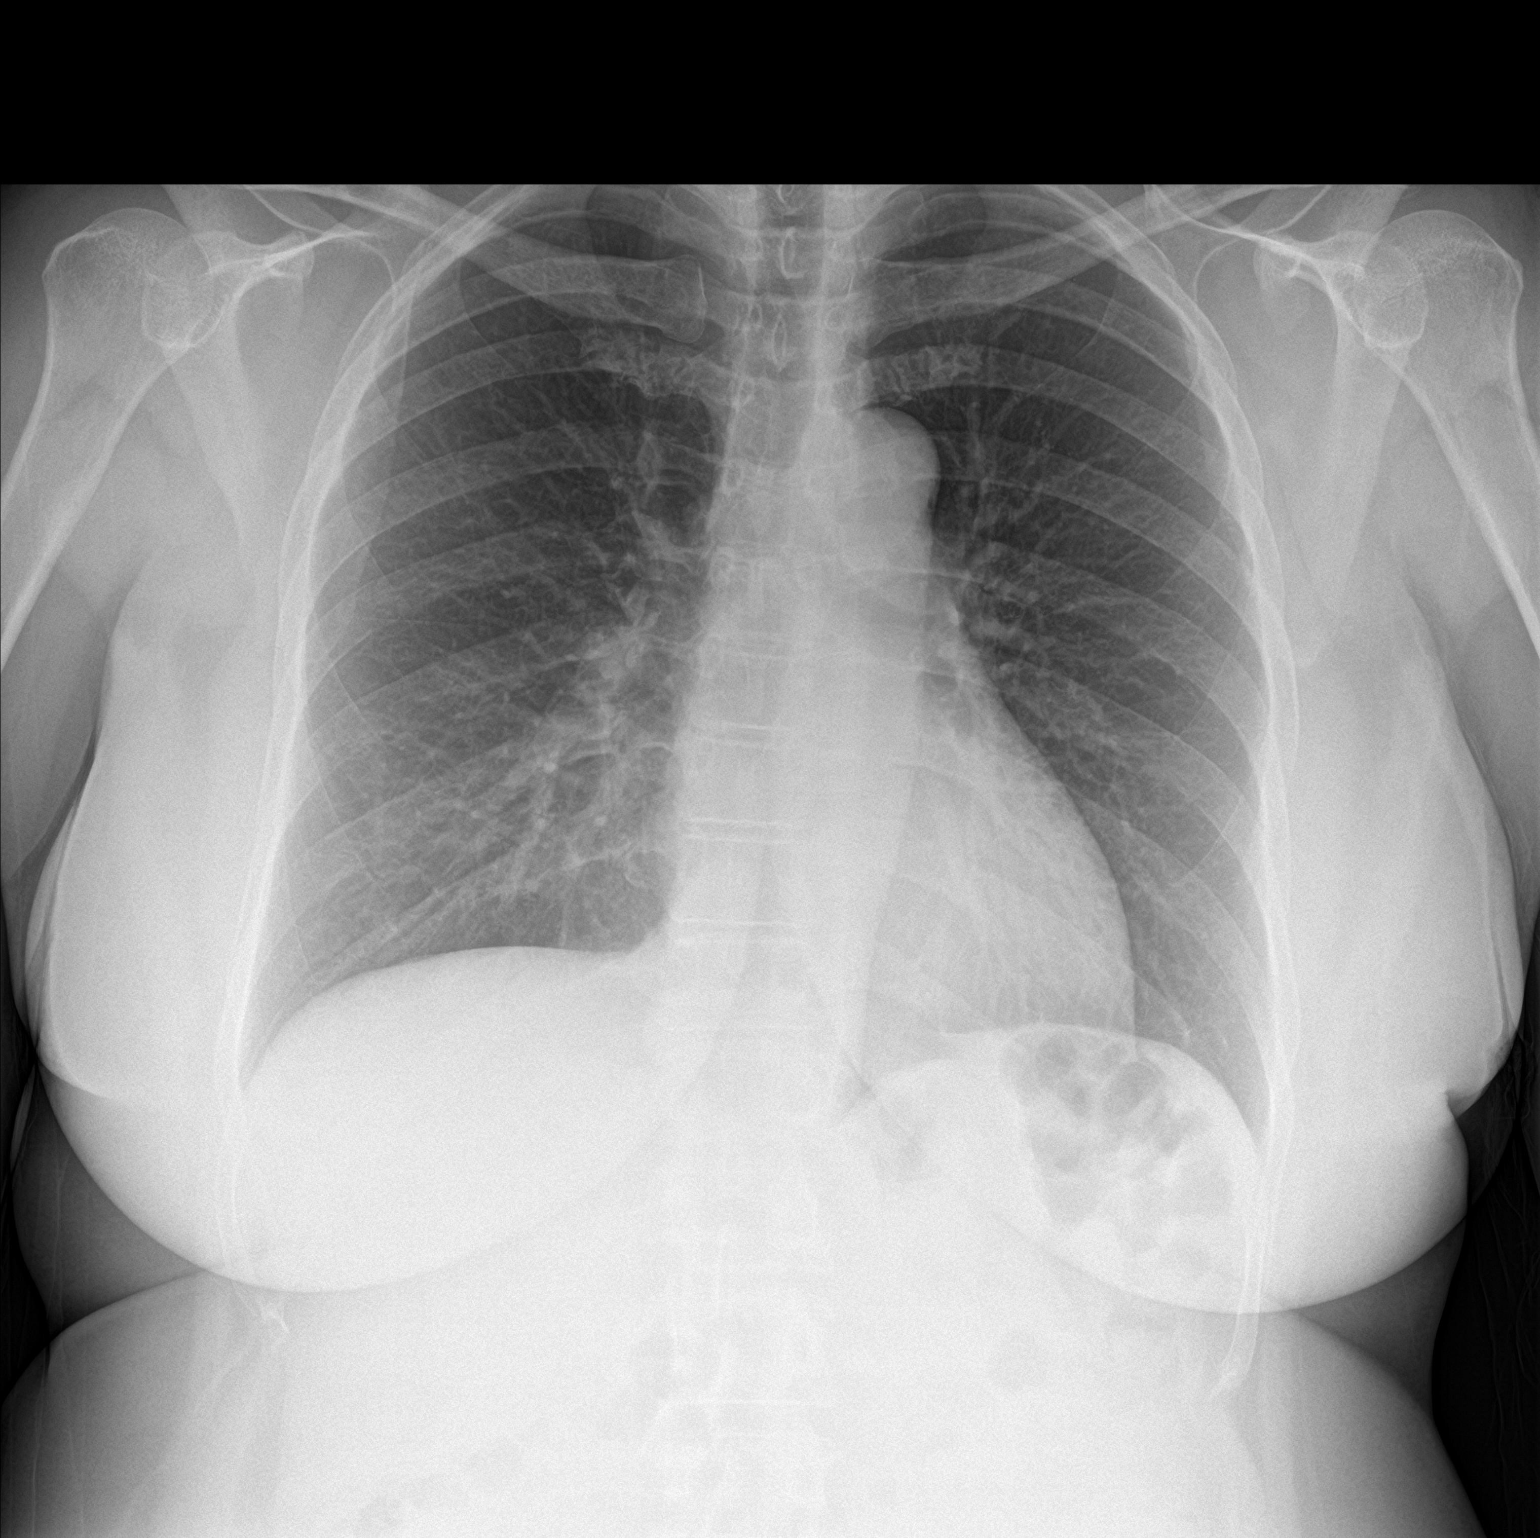

[chest lat]
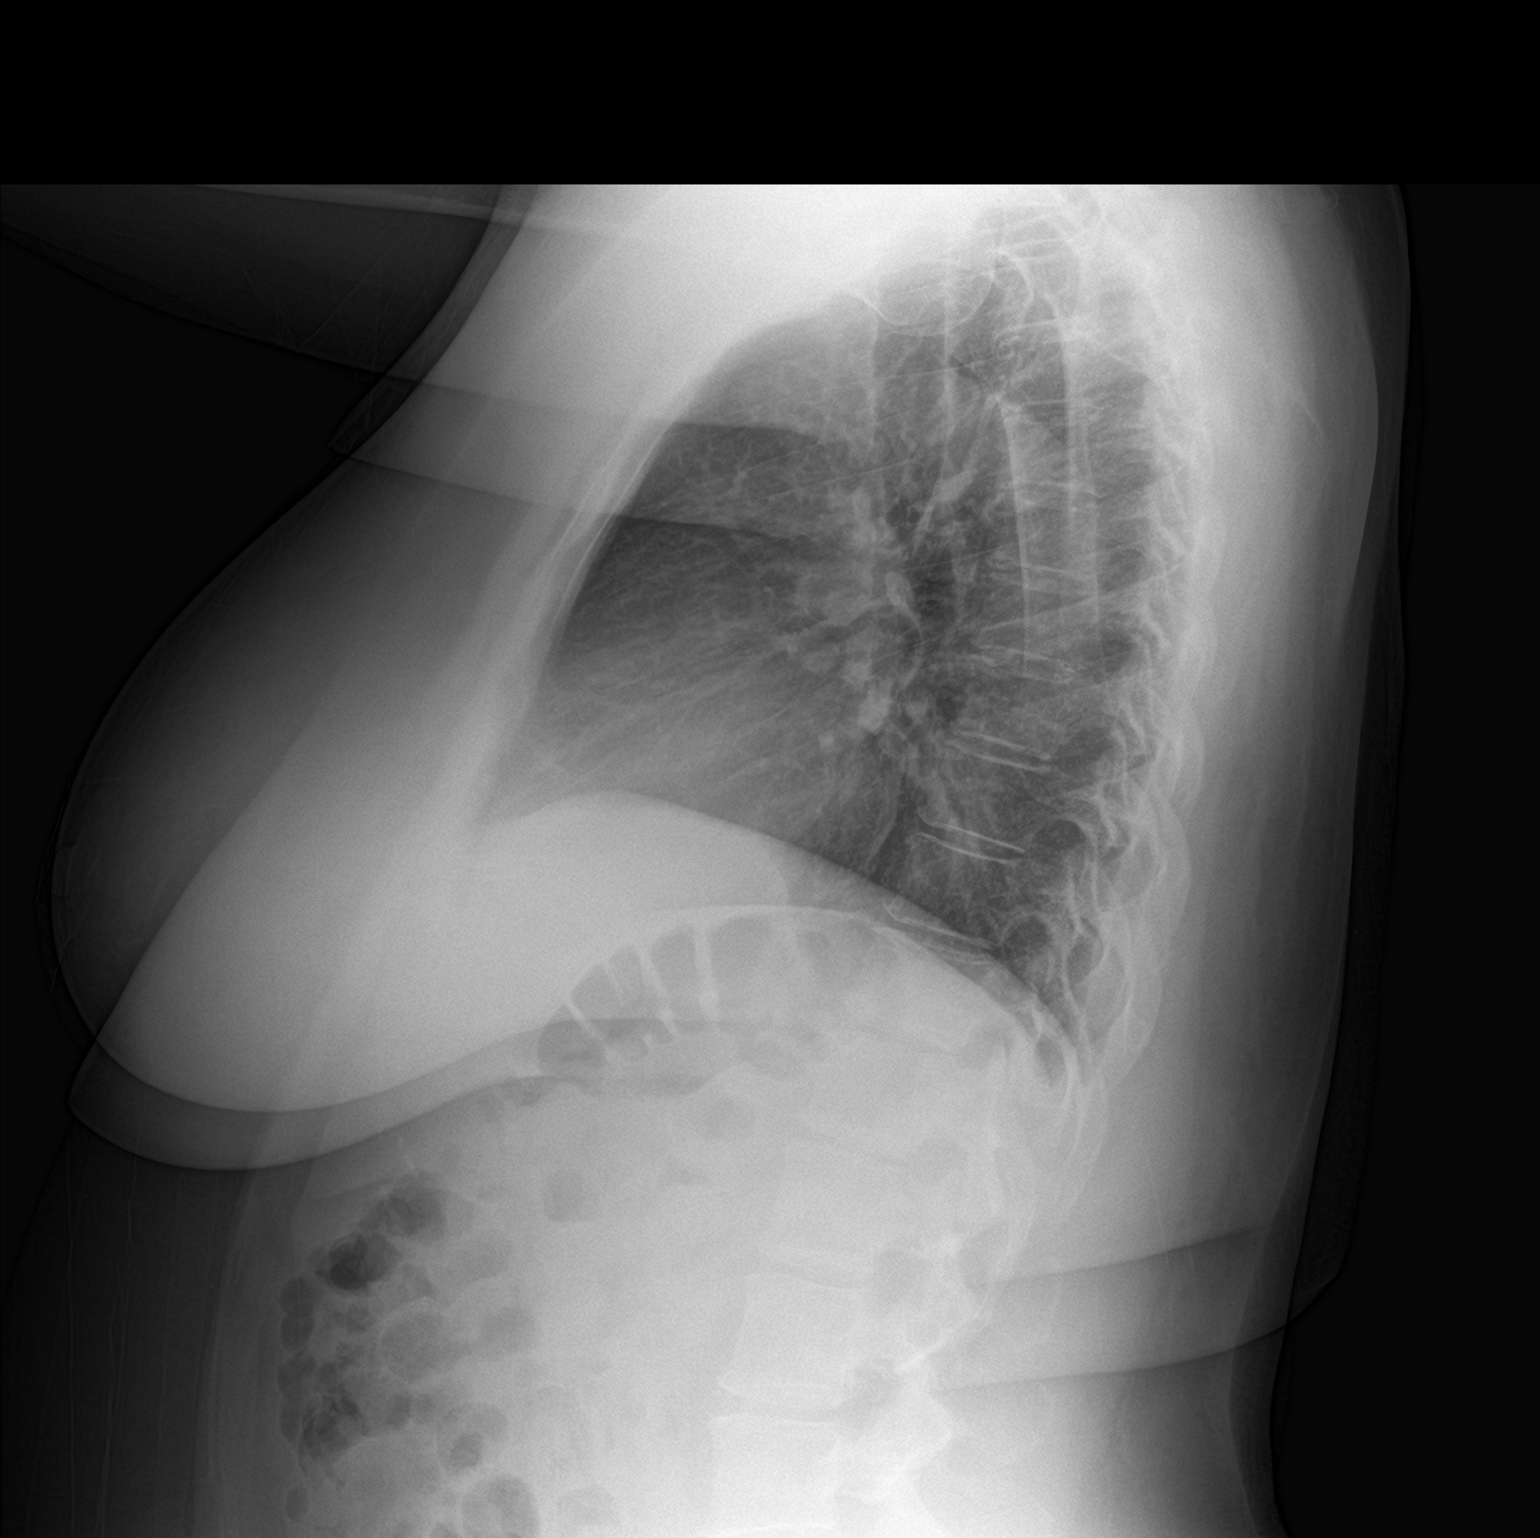

[2 of 2 positions shown; findings below may reference images not displayed]

FINDINGS: The lungs are well-aerated and clear. There is no evidence of focal
opacification, pleural effusion or pneumothorax.

The heart is normal in size; the mediastinal contour is within
normal limits. No acute osseous abnormalities are seen.
IMPRESSION: No acute cardiopulmonary process seen.

## 2020-08-21 ENCOUNTER — Ambulatory Visit (INDEPENDENT_AMBULATORY_CARE_PROVIDER_SITE_OTHER): Payer: Self-pay | Admitting: Adult Health

## 2020-08-21 ENCOUNTER — Other Ambulatory Visit: Payer: Self-pay

## 2020-08-21 ENCOUNTER — Encounter: Payer: Self-pay | Admitting: Adult Health

## 2020-08-21 VITALS — BP 178/108 | HR 43 | Temp 99.2°F | Ht 64.25 in | Wt 193.0 lb

## 2020-08-21 DIAGNOSIS — Z1211 Encounter for screening for malignant neoplasm of colon: Secondary | ICD-10-CM

## 2020-08-21 DIAGNOSIS — Z7689 Persons encountering health services in other specified circumstances: Secondary | ICD-10-CM

## 2020-08-21 DIAGNOSIS — I10 Essential (primary) hypertension: Secondary | ICD-10-CM

## 2020-08-21 DIAGNOSIS — E668 Other obesity: Secondary | ICD-10-CM

## 2020-08-21 MED ORDER — LISINOPRIL-HYDROCHLOROTHIAZIDE 20-25 MG PO TABS: 1.0000 | ORAL_TABLET | Freq: Every day | ORAL | 0 refills | Status: DC

## 2020-08-21 NOTE — Patient Instructions (Addendum)
It was great seeing and I am so happy to have you on the team   I would like to see you back in one month for follow up   I will follow up with you regarding your blood work

## 2020-08-21 NOTE — Progress Notes (Signed)
Patient presents to clinic today to establish care. She is a 55 year old female who  has a past medical history of Depression, Frequent headaches, Heart murmur, Hypertension, and Stroke ([place]).  She has not been seen in " 10 years"    Acute Concerns: Establish Care  Chronic Issues: Essential Hypertension - has been prescribed lisinopril/HCTZ in the past. She has not taken any medication in over a year.  Does report headaches over the last few months.  Denies blurred vision. BP Readings from Last 3 Encounters:  08/21/20 (!) 178/108  09/13/18 (!) 141/99  04/19/18 (S) (!) 178/116   CVA - reports " mild stroke" back in 2006. No residual deficits.  No slurred speech currently.  Health Maintenance: Dental -- Does not do routine care Vision -- Does not do routine care  Colonoscopy -- Never had Mammogram -- Never had PAP -- " a long time ago"    Past Medical History:  Diagnosis Date   Depression    Frequent headaches    Heart murmur    Hypertension    Stroke ([place])    Mild, 2006     Past Surgical History:  Procedure Laterality Date   ABDOMINAL HYSTERECTOMY     APPENDECTOMY      No current outpatient medications on file prior to visit.   No current facility-administered medications on file prior to visit.    No Known Allergies  Family History  Problem Relation Age of Onset   Arthritis Mother    Thyroid cancer Sister    Bone cancer Sister    Stroke Maternal Aunt    Diabetes Maternal Aunt    Thyroid disease Maternal Aunt    Hypertension Maternal Aunt     Social History   Socioeconomic History   Marital status: Single    Spouse name: Not on file   Number of children: Not on file   Years of education: Not on file   Highest education level: Not on file  Occupational History   Not on file  Tobacco Use   Smoking status: Never   Smokeless tobacco: Never  Vaping Use   Vaping Use: Never used  Substance and Sexual Activity   Alcohol use: No   Drug use: No    Sexual activity: Yes    Birth control/protection: Surgical  Other Topics Concern   Not on file  Social History Narrative   Not on file   Social Determinants of Health   Financial Resource Strain: Not on file  Food Insecurity: Not on file  Transportation Needs: Not on file  Physical Activity: Not on file  Stress: Not on file  Social Connections: Not on file  Intimate Partner Violence: Not on file    Review of Systems  Constitutional: Negative.   HENT: Negative.    Respiratory: Negative.    Cardiovascular: Negative.   Genitourinary: Negative.   Musculoskeletal: Negative.   Skin: Negative.   Neurological:  Positive for headaches.  Psychiatric/Behavioral: Negative.    All other systems reviewed and are negative.  BP (!) 178/108   Pulse (!) 43   Temp 99.2 F (37.3 C) (Oral)   Ht 5' 4.25" (1.632 m)   Wt 193 lb (87.5 kg)   SpO2 99%   BMI 32.87 kg/m   Physical Exam Vitals and nursing note reviewed.  Constitutional:      Appearance: Normal appearance. She is obese.  HENT:     Nose: Nose normal.     Mouth/Throat:  Mouth: Mucous membranes are moist.     Pharynx: Oropharynx is clear.  Eyes:     Extraocular Movements: Extraocular movements intact.     Pupils: Pupils are equal, round, and reactive to light.  Cardiovascular:     Rate and Rhythm: Normal rate and regular rhythm.     Pulses: Normal pulses.     Heart sounds: Normal heart sounds.  Pulmonary:     Effort: Pulmonary effort is normal.     Breath sounds: Normal breath sounds.  Abdominal:     General: Abdomen is flat. Bowel sounds are normal.     Palpations: Abdomen is soft.  Musculoskeletal:        General: Normal range of motion.     Cervical back: Normal range of motion and neck supple.  Skin:    General: Skin is warm and dry.  Neurological:     General: No focal deficit present.     Mental Status: She is alert and oriented to person, place, and time.  Psychiatric:        Mood and Affect: Mood  normal.        Behavior: Behavior normal.        Thought Content: Thought content normal.        Judgment: Judgment normal.   Assessment/Plan: 1. Encounter to establish care - Establish GYN care and needs mammo - Encouraged weight loss through diet and exercise  2. Primary hypertension - Will restart on HCTZ/lisnopril.  - Follow up in 30days - lisinopril-hydrochlorothiazide (ZESTORETIC) 20-25 MG tablet; Take 1 tablet by mouth daily.  Dispense: 90 tablet; Refill: 0 - Hemoglobin A1c; Future - TSH; Future - CBC with Differential/Platelet; Future - Comprehensive metabolic panel; Future - Comprehensive metabolic panel - CBC with Differential/Platelet - TSH - Hemoglobin A1c  3. Colon cancer screening  - Ambulatory referral to Gastroenterology  4. Other obesity - Encouraged lifestyle modifications  - Hemoglobin A1c; Future - TSH; Future - CBC with Differential/Platelet; Future - Comprehensive metabolic panel; Future - Comprehensive metabolic panel - CBC with Differential/Platelet - TSH - Hemoglobin A1c   Dorothyann Peng, NP

## 2020-08-22 LAB — CBC WITH DIFFERENTIAL/PLATELET
Basophils Absolute: 0 10*3/uL (ref 0.0–0.1)
Basophils Relative: 0.8 % (ref 0.0–3.0)
Eosinophils Absolute: 0 10*3/uL (ref 0.0–0.7)
Eosinophils Relative: 0.6 % (ref 0.0–5.0)
HCT: 40.3 % (ref 36.0–46.0)
Hemoglobin: 12.5 g/dL (ref 12.0–15.0)
Lymphocytes Relative: 29.3 % (ref 12.0–46.0)
Lymphs Abs: 1.8 10*3/uL (ref 0.7–4.0)
MCHC: 31 g/dL (ref 30.0–36.0)
MCV: 77.4 fl — ABNORMAL LOW (ref 78.0–100.0)
Monocytes Absolute: 1.1 10*3/uL — ABNORMAL HIGH (ref 0.1–1.0)
Monocytes Relative: 17.7 % — ABNORMAL HIGH (ref 3.0–12.0)
Neutro Abs: 3.1 10*3/uL (ref 1.4–7.7)
Neutrophils Relative %: 51.6 % (ref 43.0–77.0)
Platelets: 329 10*3/uL (ref 150.0–400.0)
RBC: 5.21 Mil/uL — ABNORMAL HIGH (ref 3.87–5.11)
RDW: 14.6 % (ref 11.5–15.5)
WBC: 6.1 10*3/uL (ref 4.0–10.5)

## 2020-08-22 LAB — COMPREHENSIVE METABOLIC PANEL
ALT: 18 U/L (ref 0–35)
AST: 17 U/L (ref 0–37)
Albumin: 4.2 g/dL (ref 3.5–5.2)
Alkaline Phosphatase: 66 U/L (ref 39–117)
BUN: 7 mg/dL (ref 6–23)
CO2: 31 mEq/L (ref 19–32)
Calcium: 9 mg/dL (ref 8.4–10.5)
Chloride: 101 mEq/L (ref 96–112)
Creatinine, Ser: 0.69 mg/dL (ref 0.40–1.20)
GFR: 98.52 mL/min (ref >60.00)
Glucose, Bld: 83 mg/dL (ref 70–99)
Potassium: 3.9 mEq/L (ref 3.5–5.1)
Sodium: 138 mEq/L (ref 135–145)
Total Bilirubin: 0.3 mg/dL (ref 0.2–1.2)
Total Protein: 7.9 g/dL (ref 6.0–8.3)

## 2020-08-22 LAB — TSH: TSH: 0.8 u[IU]/mL (ref 0.35–5.50)

## 2020-08-22 LAB — HEMOGLOBIN A1C: Hgb A1c MFr Bld: 6.3 % (ref 4.6–6.5)

## 2020-09-21 ENCOUNTER — Ambulatory Visit (INDEPENDENT_AMBULATORY_CARE_PROVIDER_SITE_OTHER): Payer: Self-pay | Admitting: Adult Health

## 2020-09-21 ENCOUNTER — Other Ambulatory Visit: Payer: Self-pay

## 2020-09-21 ENCOUNTER — Encounter: Payer: Self-pay | Admitting: Adult Health

## 2020-09-21 VITALS — BP 140/96 | HR 77 | Temp 98.9°F | Ht 64.5 in | Wt 190.0 lb

## 2020-09-21 DIAGNOSIS — I10 Essential (primary) hypertension: Secondary | ICD-10-CM

## 2020-09-21 LAB — BASIC METABOLIC PANEL
BUN: 10 mg/dL (ref 6–23)
CO2: 28 mEq/L (ref 19–32)
Calcium: 9.4 mg/dL (ref 8.4–10.5)
Chloride: 102 mEq/L (ref 96–112)
Creatinine, Ser: 0.7 mg/dL (ref 0.40–1.20)
GFR: 98.12 mL/min (ref >60.00)
Glucose, Bld: 71 mg/dL (ref 70–99)
Potassium: 3.8 mEq/L (ref 3.5–5.1)
Sodium: 139 mEq/L (ref 135–145)

## 2020-09-21 MED ORDER — OLMESARTAN MEDOXOMIL-HCTZ 40-25 MG PO TABS: 1.0000 | ORAL_TABLET | Freq: Every day | ORAL | 0 refills | Status: DC

## 2020-09-21 NOTE — Progress Notes (Signed)
Subjective:    Patient ID: Toni Roberson, female    DOB: 12-27-1965, 55 y.o.   MRN: [id number]  HPI 55 year old female who  has a past medical history of Depression, Frequent headaches, Heart murmur, Hypertension, and Stroke ([place]).  She presents to the office today for one month follow up regarding HTN.  When she was last seen roughly 30 days ago for her establish care visit she had not been seen by a primary care provider in about 10 years.  She had been prescribed lisinopril/HCTZ in the past but had not taken this medication in greater than 1 year.  At this time she was reporting headaches over the previous few months and just not feeling well.  She was placed back on HCTZ/lisinopril 20-25 mg  Today she reports that she has been monitoring her blood pressure at home with readings in the 130s to 140s over 90s.  She is no longer having headaches.  She is feeling much better.  She is walking up to 2 miles per day, has cut out sugars, and is drinking more water.  The only thing that she has developed since starting this blood pressure medication is a dry constant cough.    BP Readings from Last 3 Encounters:  09/21/20 (!) 140/96  08/21/20 (!) 178/108  09/13/18 (!) 141/99   Wt Readings from Last 3 Encounters:  09/21/20 190 lb (86.2 kg)  08/21/20 193 lb (87.5 kg)  03/20/16 158 lb (71.7 kg)   Review of Systems See HPI   Past Medical History:  Diagnosis Date   Depression    Frequent headaches    Heart murmur    Hypertension    Stroke ([place])    Mild, 2006     Social History   Socioeconomic History   Marital status: Single    Spouse name: Not on file   Number of children: Not on file   Years of education: Not on file   Highest education level: Not on file  Occupational History   Not on file  Tobacco Use   Smoking status: Never   Smokeless tobacco: Never  Vaping Use   Vaping Use: Never used  Substance and Sexual Activity   Alcohol use: No   Drug use: No   Sexual  activity: Yes    Birth control/protection: Surgical  Other Topics Concern   Not on file  Social History Narrative   Not on file   Social Determinants of Health   Financial Resource Strain: Not on file  Food Insecurity: Not on file  Transportation Needs: Not on file  Physical Activity: Not on file  Stress: Not on file  Social Connections: Not on file  Intimate Partner Violence: Not on file    Past Surgical History:  Procedure Laterality Date   ABDOMINAL HYSTERECTOMY     APPENDECTOMY      Family History  Problem Relation Age of Onset   Arthritis Mother    Thyroid cancer Sister    Bone cancer Sister    Stroke Maternal Aunt    Diabetes Maternal Aunt    Thyroid disease Maternal Aunt    Hypertension Maternal Aunt     Allergies  Allergen Reactions   Lisinopril Cough    No current outpatient medications on file prior to visit.   No current facility-administered medications on file prior to visit.    BP (!) 140/96   Pulse 77   Temp 98.9 F (37.2 C) (Oral)   Ht 5' 4.5" (  1.638 m)   Wt 190 lb (86.2 kg)   SpO2 99%   BMI 32.11 kg/m       Objective:   Physical Exam Vitals and nursing note reviewed.  Constitutional:      Appearance: Normal appearance. She is obese.  Cardiovascular:     Rate and Rhythm: Normal rate and regular rhythm.     Pulses: Normal pulses.     Heart sounds: Normal heart sounds.  Pulmonary:     Effort: Pulmonary effort is normal.     Breath sounds: Normal breath sounds.  Skin:    General: Skin is warm and dry.  Neurological:     General: No focal deficit present.     Mental Status: She is alert and oriented to person, place, and time.  Psychiatric:        Mood and Affect: Mood normal.        Behavior: Behavior normal.        Thought Content: Thought content normal.        Judgment: Judgment normal.      Assessment & Plan:  1. Primary hypertension -We will switch her to Benicar HCT due to dry cough.  Continue to monitor blood  pressure at home.  Continue with lifestyle modifications.  Follow-up in 30 days or sooner if needed - olmesartan-hydrochlorothiazide (BENICAR HCT) 40-25 MG tablet; Take 1 tablet by mouth daily.  Dispense: 30 tablet; Refill: 0 - Basic Metabolic Panel  Dorothyann Peng, NP

## 2020-09-21 NOTE — Patient Instructions (Signed)
It was great seeing you today   I am going to switch your blood pressure medication due to the cough   Please follow up in 30 days   Continue to monitor your blood pressure at home

## 2020-09-27 ENCOUNTER — Other Ambulatory Visit: Payer: Self-pay | Admitting: Adult Health

## 2020-09-27 ENCOUNTER — Telehealth: Payer: Self-pay | Admitting: Adult Health

## 2020-09-27 MED ORDER — OLMESARTAN MEDOXOMIL 40 MG PO TABS: 40.0000 mg | ORAL_TABLET | Freq: Every day | ORAL | 0 refills | Status: DC

## 2020-09-27 MED ORDER — HYDROCHLOROTHIAZIDE 25 MG PO TABS: 25.0000 mg | ORAL_TABLET | Freq: Every day | ORAL | 0 refills | Status: DC

## 2020-09-27 NOTE — Telephone Encounter (Signed)
PT called to advise that the olmesartan-hydrochlorothiazide (BENICAR HCT) 40-25 MG tablet that was called in is $85 and they would like to see what assistance can be done. Such as prescribing a generic or anything.

## 2020-10-22 ENCOUNTER — Other Ambulatory Visit: Payer: Self-pay

## 2020-10-23 ENCOUNTER — Ambulatory Visit: Payer: Self-pay | Admitting: Adult Health

## 2020-11-03 ENCOUNTER — Other Ambulatory Visit: Payer: Self-pay | Admitting: Adult Health

## 2021-01-02 ENCOUNTER — Encounter: Payer: Self-pay | Admitting: Internal Medicine

## 2021-03-05 ENCOUNTER — Ambulatory Visit (AMBULATORY_SURGERY_CENTER): Payer: Self-pay | Admitting: *Deleted

## 2021-03-05 ENCOUNTER — Other Ambulatory Visit: Payer: Self-pay

## 2021-03-05 VITALS — Ht 64.5 in | Wt 180.0 lb

## 2021-03-05 DIAGNOSIS — Z1211 Encounter for screening for malignant neoplasm of colon: Secondary | ICD-10-CM

## 2021-03-05 NOTE — Progress Notes (Signed)
No egg or soy allergy known to patient  No issues known to pt with past sedation with any surgeries or procedures Patient denies ever being told they had issues or difficulty with intubation  No FH of Malignant Hyperthermia Pt is not on diet pills Pt is not on  home 02  Pt is not on blood thinners  Pt denies issues with constipation  No A fib or A flutter  Pt is  vaccinated  for Covid   Due to the COVID-19 pandemic we are asking patients to follow certain guidelines in PV and the [place]   Pt aware of COVID protocols and [facility] guidelines   PV completed over the phone. Pt verified name, DOB, address and insurance during PV today.  Pt mailed instruction packet with copy of consent form to read and not return, and instructions.  Pt encouraged to call with questions or issues.  Sample sheet of items to purchase over the counter for prep.pt. calling about health insurance for clarification.

## 2021-03-15 ENCOUNTER — Telehealth: Payer: Self-pay | Admitting: Internal Medicine

## 2021-03-15 NOTE — Telephone Encounter (Signed)
Hey Dr. Lorenso Courier,   Patient called in to cancel procedure 2/21 due to problems with her insurance. She rescheduled for 4/11.  Thank you

## 2021-03-19 ENCOUNTER — Encounter: Payer: Self-pay | Admitting: Internal Medicine

## 2021-04-11 ENCOUNTER — Other Ambulatory Visit: Payer: Self-pay | Admitting: Adult Health

## 2021-04-11 MED ORDER — OLMESARTAN MEDOXOMIL 40 MG PO TABS: 40.0000 mg | ORAL_TABLET | Freq: Every day | ORAL | 1 refills | Status: AC

## 2021-04-11 MED ORDER — HYDROCHLOROTHIAZIDE 25 MG PO TABS: 25.0000 mg | ORAL_TABLET | Freq: Every day | ORAL | 1 refills | Status: AC

## 2021-04-30 ENCOUNTER — Encounter: Payer: Self-pay | Admitting: Certified Registered Nurse Anesthetist

## 2021-05-07 ENCOUNTER — Encounter: Payer: Self-pay | Admitting: Internal Medicine

## 2021-06-17 ENCOUNTER — Telehealth: Payer: Self-pay | Admitting: Internal Medicine

## 2021-06-17 NOTE — Telephone Encounter (Signed)
Inbound call from patient stating she needed to go over new prep instructions because she has her old instructions from where she canceled. Patient is scheduled for 5/24 at 8:30. Please advise.

## 2021-06-17 NOTE — Telephone Encounter (Signed)
Spoke with pt - we went over miralax instructions for wed 5-24, I sent her an email of instructions to [email or web address] with no pt identifiers and  pt will pick up a copy of instructions 2nd floor today - pt verbalized understanding

## 2021-06-17 NOTE — Telephone Encounter (Signed)
COLON 5-24 Wednesday AT 830 AM   TRIED PT AT 6269 AM- NO ANSWER, VMB  NOT SET UP SO COULD NOT LM   TRIED PT AT 1146 AM, NO ANSWER, VMB NOT SET UP

## 2021-06-19 ENCOUNTER — Ambulatory Visit (AMBULATORY_SURGERY_CENTER): Payer: Self-pay | Admitting: Internal Medicine

## 2021-06-19 ENCOUNTER — Encounter: Payer: Self-pay | Admitting: Internal Medicine

## 2021-06-19 VITALS — BP 142/88 | HR 75 | Temp 99.1°F | Resp 13 | Ht 64.5 in | Wt 180.0 lb

## 2021-06-19 DIAGNOSIS — D124 Benign neoplasm of descending colon: Secondary | ICD-10-CM

## 2021-06-19 DIAGNOSIS — Z1211 Encounter for screening for malignant neoplasm of colon: Secondary | ICD-10-CM

## 2021-06-19 DIAGNOSIS — D128 Benign neoplasm of rectum: Secondary | ICD-10-CM

## 2021-06-19 DIAGNOSIS — D122 Benign neoplasm of ascending colon: Secondary | ICD-10-CM

## 2021-06-19 DIAGNOSIS — D120 Benign neoplasm of cecum: Secondary | ICD-10-CM

## 2021-06-19 MED ORDER — SODIUM CHLORIDE 0.9 % IV SOLN: 500.0000 mL | Freq: Once | INTRAVENOUS | Status: DC

## 2021-06-19 NOTE — Progress Notes (Signed)
Called to room to assist during endoscopic procedure.  Patient ID and intended procedure confirmed with present staff. Received instructions for my participation in the procedure from the performing physician.  

## 2021-06-19 NOTE — Progress Notes (Signed)
GASTROENTEROLOGY PROCEDURE H&P NOTE   Primary Care Physician: Dorothyann Peng, NP    Reason for Procedure:   Colon cancer screening  Plan:    Colonoscopy  Patient is appropriate for endoscopic procedure(s) in the ambulatory ([place]) setting.  The nature of the procedure, as well as the risks, benefits, and alternatives were carefully and thoroughly reviewed with the patient. Ample time for discussion and questions allowed. The patient understood, was satisfied, and agreed to proceed.     HPI: Toni Roberson is a 56 y.o. female who presents for colonoscopy for colon cancer screening. Denies blood in stools, changes in bowel habits, weight loss. Denies fam hx of colon cancer. This is her first colonoscopy.  Past Medical History:  Diagnosis Date   Depression    Frequent headaches    Heart murmur    Hypertension    Stroke ([place])    Mild, 2006     Past Surgical History:  Procedure Laterality Date   ABDOMINAL HYSTERECTOMY     APPENDECTOMY      Prior to Admission medications   Medication Sig Start Date End Date Taking? Authorizing Provider  hydrochlorothiazide (HYDRODIURIL) 25 MG tablet Take 1 tablet (25 mg total) by mouth daily. 04/11/21   Nafziger, Tommi Rumps, NP  Multiple Vitamin (MULTIVITAMIN) tablet Take 1 tablet by mouth daily.    [provider]  olmesartan (BENICAR) 40 MG tablet Take 1 tablet (40 mg total) by mouth daily. 04/11/21   Dorothyann Peng, NP    Current Outpatient Medications  Medication Sig Dispense Refill   hydrochlorothiazide (HYDRODIURIL) 25 MG tablet Take 1 tablet (25 mg total) by mouth daily. 90 tablet 1   Multiple Vitamin (MULTIVITAMIN) tablet Take 1 tablet by mouth daily.     olmesartan (BENICAR) 40 MG tablet Take 1 tablet (40 mg total) by mouth daily. 90 tablet 1   No current facility-administered medications for this visit.    Allergies as of 06/19/2021 - Review Complete 06/19/2021  Allergen Reaction Noted   Lisinopril Cough 09/21/2020     Family History  Problem Relation Age of Onset   Arthritis Mother    Thyroid cancer Sister    Bone cancer Sister    Stroke Maternal Aunt    Diabetes Maternal Aunt    Thyroid disease Maternal Aunt    Hypertension Maternal Aunt    Colon cancer Neg Hx    Colon polyps Neg Hx    Esophageal cancer Neg Hx    Rectal cancer Neg Hx    Stomach cancer Neg Hx     Social History   Socioeconomic History   Marital status: Single    Spouse name: Not on file   Number of children: Not on file   Years of education: Not on file   Highest education level: Not on file  Occupational History   Not on file  Tobacco Use   Smoking status: Never   Smokeless tobacco: Never  Vaping Use   Vaping Use: Never used  Substance and Sexual Activity   Alcohol use: No   Drug use: No   Sexual activity: Yes    Birth control/protection: Surgical  Other Topics Concern   Not on file  Social History Narrative   Not on file   Social Determinants of Health   Financial Resource Strain: Not on file  Food Insecurity: Not on file  Transportation Needs: Not on file  Physical Activity: Not on file  Stress: Not on file  Social Connections: Not on file  Intimate  Partner Violence: Not on file    Physical Exam: Vital signs in last 24 hours: BP (!) 132/93   Pulse 75   Temp 99.1 F (37.3 C) (Skin)   Ht 5' 4.5" (1.638 m)   Wt 180 lb (81.6 kg)   SpO2 98%   BMI 30.42 kg/m  GEN: NAD EYE: Sclerae anicteric ENT: MMM CV: Non-tachycardic Pulm: No increased work of breathing GI: Soft, NT/ND NEURO:  Alert & Oriented   [name], MD [facility]  06/19/2021 7:44 AM

## 2021-06-19 NOTE — Op Note (Signed)
[facility] Patient Name: Toni Roberson Procedure Date: 06/19/2021 8:39 AM MRN: [id number] Endoscopist: Sonny Masters "Toni Roberson ,  Age: 56 Referring MD:  Date of Birth: [birth year] Gender: Female Account #: [id number] Procedure:                Colonoscopy Indications:              Screening for colorectal malignant neoplasm, This                            is the patient's first colonoscopy Medicines:                Monitored Anesthesia Care Procedure:                Pre-Anesthesia Assessment:                           - Prior to the procedure, a History and Physical                            was performed, and patient medications and                            allergies were reviewed. The patient's tolerance of                            previous anesthesia was also reviewed. The risks                            and benefits of the procedure and the sedation                            options and risks were discussed with the patient.                            All questions were answered, and informed consent                            was obtained. Prior Anticoagulants: The patient has                            taken no previous anticoagulant or antiplatelet                            agents. ASA Grade Assessment: II - A patient with                            mild systemic disease. After reviewing the risks                            and benefits, the patient was deemed in                            satisfactory condition to undergo the procedure.  After obtaining informed consent, the colonoscope                            was passed under direct vision. Throughout the                            procedure, the patient's blood pressure, pulse, and                            oxygen saturations were monitored continuously. The                            Olympus CF-HQ190L [id number]) Colonoscope was                            introduced through the anus  and advanced to the the                            terminal ileum. The colonoscopy was performed                            without difficulty. The patient tolerated the                            procedure well. The quality of the bowel                            preparation was good. The terminal ileum, ileocecal                            valve, appendiceal orifice, and rectum were                            photographed. Scope In: 8:53:54 AM Scope Out: 9:15:28 AM Scope Withdrawal Time: 0 hours 18 minutes 25 seconds  Total Procedure Duration: 0 hours 21 minutes 34 seconds  Findings:                 The terminal ileum appeared normal.                           Multiple small and large-mouthed diverticula were                            found in the entire colon.                           Two sessile polyps were found in the ascending                            colon and cecum. The polyps were 3 to 4 mm in size.                            These polyps were removed with a cold snare.  Resection and retrieval were complete.                           Two sessile polyps were found in the rectum and                            descending colon. The polyps were 2 to 3 mm in                            size. These polyps were removed with a cold snare.                            Resection and retrieval were complete.                           Non-bleeding internal hemorrhoids were found during                            retroflexion. Complications:            No immediate complications. Estimated Blood Loss:     Estimated blood loss was minimal. Impression:               - The examined portion of the ileum was normal.                           - Diverticulosis in the entire examined colon.                           - Two 3 to 4 mm polyps in the ascending colon and                            in the cecum, removed with a cold snare. Resected                             and retrieved.                           - Two 2 to 3 mm polyps in the rectum and in the                            descending colon, removed with a cold snare.                            Resected and retrieved.                           - Non-bleeding internal hemorrhoids. Recommendation:           - Discharge patient to home (with escort).                           - Await pathology results.                           -  The findings and recommendations were discussed                            with the patient. Sonny Masters "Toni Roberson,  06/19/2021 9:19:48 AM

## 2021-06-19 NOTE — Progress Notes (Signed)
Report to PACU, RN, vss, BBS= Clear.  

## 2021-06-19 NOTE — Patient Instructions (Signed)
Thank you for coming in to see [place] today. Resume your normal diet today, and your regular activities tomorrow. Polyp biopsies will be available in 1-2 weeks.  Recommendation will be made at that time for future colonoscopy.    YOU HAD AN ENDOSCOPIC PROCEDURE TODAY AT [facility]:   Refer to the procedure report that was given to you for any specific questions about what was found during the examination.  If the procedure report does not answer your questions, please call your gastroenterologist to clarify.  If you requested that your care partner not be given the details of your procedure findings, then the procedure report has been included in a sealed envelope for you to review at your convenience later.  YOU SHOULD EXPECT: Some feelings of bloating in the abdomen. Passage of more gas than usual.  Walking can help get rid of the air that was put into your GI tract during the procedure and reduce the bloating. If you had a lower endoscopy (such as a colonoscopy or flexible sigmoidoscopy) you may notice spotting of blood in your stool or on the toilet paper. If you underwent a bowel prep for your procedure, you may not have a normal bowel movement for a few days.  Please Note:  You might notice some irritation and congestion in your nose or some drainage.  This is from the oxygen used during your procedure.  There is no need for concern and it should clear up in a day or so.  SYMPTOMS TO REPORT IMMEDIATELY:  Following lower endoscopy (colonoscopy or flexible sigmoidoscopy):  Excessive amounts of blood in the stool  Significant tenderness or worsening of abdominal pains  Swelling of the abdomen that is new, acute  Fever of 100F or higher    For urgent or emergent issues, a gastroenterologist can be reached at any hour by calling [phone]. Do not use MyChart messaging for urgent concerns.    DIET:  We do recommend a small meal at first, but then you may proceed to your  regular diet.  Drink plenty of fluids but you should avoid alcoholic beverages for 24 hours.  ACTIVITY:  You should plan to take it easy for the rest of today and you should NOT DRIVE or use heavy machinery until tomorrow (because of the sedation medicines used during the test).    FOLLOW UP: Our staff will call the number listed on your records 48-72 hours following your procedure to check on you and address any questions or concerns that you may have regarding the information given to you following your procedure. If we do not reach you, we will leave a message.  We will attempt to reach you two times.  During this call, we will ask if you have developed any symptoms of COVID 19. If you develop any symptoms (ie: fever, flu-like symptoms, shortness of breath, cough etc.) before then, please call [phone].  If you test positive for Covid 19 in the 2 weeks post procedure, please call and report this information to [place].    If any biopsies were taken you will be contacted by phone or by letter within the next 1-3 weeks.  Please call us at [phone] if you have not heard about the biopsies in 3 weeks.    SIGNATURES/CONFIDENTIALITY: You and/or your care partner have signed paperwork which will be entered into your electronic medical record.  These signatures attest to the fact that that the information above on your After Visit  Summary has been reviewed and is understood.  Full responsibility of the confidentiality of this discharge information lies with you and/or your care-partner.  

## 2021-06-20 ENCOUNTER — Telehealth: Payer: Self-pay

## 2021-06-20 NOTE — Telephone Encounter (Signed)
No answer, unable to leave a message, B.Birda Didonato RN. 

## 2021-06-20 NOTE — Telephone Encounter (Signed)
No answer on follow up call. 

## 2021-06-21 ENCOUNTER — Encounter: Payer: Self-pay | Admitting: Internal Medicine

## 2023-02-26 ENCOUNTER — Encounter: Payer: Self-pay | Admitting: Adult Health

## 2023-04-02 ENCOUNTER — Encounter: Payer: Self-pay | Admitting: Adult Health

## 2023-04-02 NOTE — Progress Notes (Deleted)
 Subjective:    Patient ID: Toni Roberson, female    DOB: Jul 16, 1965, 58 y.o.   MRN: [id number]  HPI Patient presents for yearly preventative medicine examination. She is a pleasant 58 year old female who  has a past medical history of Depression, Frequent headaches, Heart murmur, Hypertension, and Stroke (HCC).  HTN- in the past was on hydrochlorothiazide and Benicar 40 mg daily.  BP Readings from Last 3 Encounters:  06/19/21 (!) 142/88  09/21/20 (!) 140/96  08/21/20 (!) 178/108    All immunizations and health maintenance protocols were reviewed with the patient and needed orders were placed.  Appropriate screening laboratory values were ordered for the patient including screening of hyperlipidemia, renal function and hepatic function. If indicated by BPH, a PSA was ordered.  Medication reconciliation,  past medical history, social history, problem list and allergies were reviewed in detail with the patient  Goals were established with regard to weight loss, exercise, and  diet in compliance with medications Wt Readings from Last 3 Encounters:  06/19/21 180 lb (81.6 kg)  03/05/21 180 lb (81.6 kg)  09/21/20 190 lb (86.2 kg)   She is up to date on routine colon cancer screening    Review of Systems  Constitutional: Negative.   HENT: Negative.    Eyes: Negative.   Respiratory: Negative.    Cardiovascular: Negative.   Gastrointestinal: Negative.   Endocrine: Negative.   Genitourinary: Negative.   Musculoskeletal: Negative.   Skin: Negative.   Allergic/Immunologic: Negative.   Neurological: Negative.   Hematological: Negative.   Psychiatric/Behavioral: Negative.     Past Medical History:  Diagnosis Date   Depression    Frequent headaches    Heart murmur    Hypertension    Stroke (HCC)    Mild, 2006     Social History   Socioeconomic History   Marital status: Single    Spouse name: Not on file   Number of children: Not on file   Years of education: Not on file    Highest education level: Not on file  Occupational History   Not on file  Tobacco Use   Smoking status: Never   Smokeless tobacco: Never  Vaping Use   Vaping status: Never Used  Substance and Sexual Activity   Alcohol use: No   Drug use: No   Sexual activity: Yes    Birth control/protection: Surgical  Other Topics Concern   Not on file  Social History Narrative   Not on file   Social Drivers of Health   Financial Resource Strain: Not on file  Food Insecurity: Not on file  Transportation Needs: Not on file  Physical Activity: Not on file  Stress: Not on file  Social Connections: Not on file  Intimate Partner Violence: Not on file    Past Surgical History:  Procedure Laterality Date   ABDOMINAL HYSTERECTOMY     APPENDECTOMY      Family History  Problem Relation Age of Onset   Arthritis Mother    Thyroid cancer Sister    Bone cancer Sister    Stroke Maternal Aunt    Diabetes Maternal Aunt    Thyroid disease Maternal Aunt    Hypertension Maternal Aunt    Colon cancer Neg Hx    Colon polyps Neg Hx    Esophageal cancer Neg Hx    Rectal cancer Neg Hx    Stomach cancer Neg Hx     Allergies  Allergen Reactions   Lisinopril Cough  Current Outpatient Medications on File Prior to Visit  Medication Sig Dispense Refill   hydrochlorothiazide (HYDRODIURIL) 25 MG tablet Take 1 tablet (25 mg total) by mouth daily. 90 tablet 1   Multiple Vitamin (MULTIVITAMIN) tablet Take 1 tablet by mouth daily.     olmesartan (BENICAR) 40 MG tablet Take 1 tablet (40 mg total) by mouth daily. 90 tablet 1   No current facility-administered medications on file prior to visit.    There were no vitals taken for this visit.      Objective:   Physical Exam Vitals and nursing note reviewed.  Constitutional:      General: She is not in acute distress.    Appearance: Normal appearance. She is not ill-appearing.  HENT:     Head: Normocephalic and atraumatic.     Right Ear:  Tympanic membrane, ear canal and external ear normal. There is no impacted cerumen.     Left Ear: Tympanic membrane, ear canal and external ear normal. There is no impacted cerumen.     Nose: Nose normal. No congestion or rhinorrhea.     Mouth/Throat:     Mouth: Mucous membranes are moist.     Pharynx: Oropharynx is clear.  Eyes:     Extraocular Movements: Extraocular movements intact.     Conjunctiva/sclera: Conjunctivae normal.     Pupils: Pupils are equal, round, and reactive to light.  Neck:     Vascular: No carotid bruit.  Cardiovascular:     Rate and Rhythm: Normal rate and regular rhythm.     Pulses: Normal pulses.     Heart sounds: No murmur heard.    No friction rub. No gallop.  Pulmonary:     Effort: Pulmonary effort is normal.     Breath sounds: Normal breath sounds.  Abdominal:     General: Abdomen is flat. Bowel sounds are normal. There is no distension.     Palpations: Abdomen is soft. There is no mass.     Tenderness: There is no abdominal tenderness. There is no guarding or rebound.     Hernia: No hernia is present.  Musculoskeletal:        General: Normal range of motion.     Cervical back: Normal range of motion and neck supple.  Lymphadenopathy:     Cervical: No cervical adenopathy.  Skin:    General: Skin is warm and dry.     Capillary Refill: Capillary refill takes less than 2 seconds.  Neurological:     General: No focal deficit present.     Mental Status: She is alert and oriented to person, place, and time.  Psychiatric:        Mood and Affect: Mood normal.        Behavior: Behavior normal.        Thought Content: Thought content normal.        Judgment: Judgment normal.           Assessment & Plan:
